# Patient Record
Sex: Female | Born: 1945 | Race: Black or African American | Hispanic: No | Marital: Married
Health system: Southern US, Community
[De-identification: ages and names within clinical notes are randomized; demographics above are authoritative.]

---

## 2016-12-13 ENCOUNTER — Other Ambulatory Visit: Payer: Self-pay | Admitting: Physician Assistant

## 2016-12-13 DIAGNOSIS — Z1231 Encounter for screening mammogram for malignant neoplasm of breast: Secondary | ICD-10-CM

## 2016-12-13 DIAGNOSIS — R5381 Other malaise: Secondary | ICD-10-CM

## 2016-12-17 ENCOUNTER — Other Ambulatory Visit: Payer: Self-pay | Admitting: Physician Assistant

## 2016-12-17 DIAGNOSIS — E2839 Other primary ovarian failure: Secondary | ICD-10-CM

## 2017-02-23 ENCOUNTER — Ambulatory Visit
Admission: RE | Admit: 2017-02-23 | Discharge: 2017-02-23 | Disposition: A | Discharge status: Home / Self Care | Payer: Medicare Other | Source: Ambulatory Visit | Attending: Physician Assistant | Admitting: Physician Assistant

## 2017-02-23 DIAGNOSIS — Z1231 Encounter for screening mammogram for malignant neoplasm of breast: Secondary | ICD-10-CM

## 2017-02-23 DIAGNOSIS — E2839 Other primary ovarian failure: Secondary | ICD-10-CM

## 2017-02-24 ENCOUNTER — Other Ambulatory Visit: Payer: Self-pay | Admitting: Physician Assistant

## 2017-02-24 DIAGNOSIS — R928 Other abnormal and inconclusive findings on diagnostic imaging of breast: Secondary | ICD-10-CM

## 2017-03-02 ENCOUNTER — Ambulatory Visit
Admission: RE | Admit: 2017-03-02 | Discharge: 2017-03-02 | Disposition: A | Discharge status: Home / Self Care | Payer: Medicare Other | Source: Ambulatory Visit | Attending: Physician Assistant | Admitting: Physician Assistant

## 2017-03-02 DIAGNOSIS — R928 Other abnormal and inconclusive findings on diagnostic imaging of breast: Secondary | ICD-10-CM

## 2018-05-08 ENCOUNTER — Other Ambulatory Visit: Payer: Self-pay | Admitting: Physician Assistant

## 2018-05-08 DIAGNOSIS — Z1231 Encounter for screening mammogram for malignant neoplasm of breast: Secondary | ICD-10-CM

## 2018-05-29 ENCOUNTER — Ambulatory Visit
Admission: RE | Admit: 2018-05-29 | Discharge: 2018-05-29 | Disposition: A | Discharge status: Home / Self Care | Payer: Medicare Other | Source: Ambulatory Visit | Attending: Physician Assistant | Admitting: Physician Assistant

## 2018-05-29 DIAGNOSIS — Z1231 Encounter for screening mammogram for malignant neoplasm of breast: Secondary | ICD-10-CM

## 2019-09-28 ENCOUNTER — Other Ambulatory Visit: Payer: Self-pay | Admitting: Family Medicine

## 2019-09-28 DIAGNOSIS — Z1231 Encounter for screening mammogram for malignant neoplasm of breast: Secondary | ICD-10-CM

## 2019-11-26 ENCOUNTER — Other Ambulatory Visit: Payer: Self-pay

## 2019-11-26 ENCOUNTER — Ambulatory Visit
Admission: RE | Admit: 2019-11-26 | Discharge: 2019-11-26 | Disposition: A | Discharge status: Home / Self Care | Payer: Medicare Other | Source: Ambulatory Visit | Attending: Family Medicine | Admitting: Family Medicine

## 2019-11-26 DIAGNOSIS — Z1231 Encounter for screening mammogram for malignant neoplasm of breast: Secondary | ICD-10-CM

## 2019-12-07 ENCOUNTER — Ambulatory Visit: Payer: Medicare Other | Attending: Internal Medicine

## 2019-12-07 DIAGNOSIS — Z23 Encounter for immunization: Secondary | ICD-10-CM

## 2019-12-07 NOTE — Progress Notes (Signed)
   Covid-19 Vaccination Clinic  Name:  Beth Ross    MRN: [id number] DOB: 1945-11-26  12/07/2019  Beth Ross was observed post Covid-19 immunization for 15 minutes without incidence. She was provided with Vaccine Information Sheet and instruction to access the V-Safe system.   Beth Ross was instructed to call 911 with any severe reactions post vaccine: Marland Kitchen Difficulty breathing  . Swelling of your face and throat  . A fast heartbeat  . A bad rash all over your body  . Dizziness and weakness    Immunizations Administered    Name Date Dose VIS Date Route   Pfizer COVID-19 Vaccine 12/07/2019 10:33 AM 0.3 mL 10/26/2019 Intramuscular   Manufacturer: [employer], [employer]   Lot: [id number]   NDC: [address]-5

## 2019-12-28 ENCOUNTER — Ambulatory Visit: Payer: Medicare Other | Attending: Internal Medicine

## 2019-12-28 DIAGNOSIS — Z23 Encounter for immunization: Secondary | ICD-10-CM

## 2019-12-28 NOTE — Progress Notes (Signed)
   Covid-19 Vaccination Clinic  Name:  Beth Ross    MRN: [id number] DOB: May 28, 1946  12/28/2019  Beth Ross was observed post Covid-19 immunization for 15 minutes without incidence. She was provided with Vaccine Information Sheet and instruction to access the V-Safe system.   Beth Ross was instructed to call 911 with any severe reactions post vaccine: Marland Kitchen Difficulty breathing  . Swelling of your face and throat  . A fast heartbeat  . A bad rash all over your body  . Dizziness and weakness    Immunizations Administered    Name Date Dose VIS Date Route   Pfizer COVID-19 Vaccine 12/28/2019  1:10 AM 0.3 mL 10/26/2019 Intramuscular   Manufacturer: [employer], [employer]   Lot: [id number]   NDC: [address]-7

## 2020-11-25 ENCOUNTER — Other Ambulatory Visit: Payer: Self-pay | Admitting: Family Medicine

## 2020-11-25 DIAGNOSIS — Z1231 Encounter for screening mammogram for malignant neoplasm of breast: Secondary | ICD-10-CM

## 2021-01-02 ENCOUNTER — Other Ambulatory Visit: Payer: Self-pay

## 2021-01-02 ENCOUNTER — Ambulatory Visit: Payer: Medicare Other | Admitting: Podiatry

## 2021-01-02 ENCOUNTER — Encounter: Payer: Self-pay | Admitting: Podiatry

## 2021-01-02 DIAGNOSIS — B351 Tinea unguium: Secondary | ICD-10-CM | POA: Diagnosis not present

## 2021-01-02 DIAGNOSIS — M79674 Pain in right toe(s): Secondary | ICD-10-CM | POA: Diagnosis not present

## 2021-01-02 DIAGNOSIS — M79675 Pain in left toe(s): Secondary | ICD-10-CM | POA: Diagnosis not present

## 2021-01-02 DIAGNOSIS — E119 Type 2 diabetes mellitus without complications: Secondary | ICD-10-CM

## 2021-01-02 NOTE — Progress Notes (Signed)
  Subjective:  Patient ID: Beth Ross, female    DOB: 03-29-1946,  MRN: [id number]  Chief Complaint  Patient presents with  . Diabetes    Diabetic foot exam    75 y.o. female returns for the above complaint.  Patient presents with thickened elongated dystrophic toenails x10.  Patient states is painful to touch.  Patient would like to have them debrided down.  She is a diabetic with last A1c of 7.  She denies any other acute complaints.  She would like for me to debride her nails down.  She was recently diagnosed with diabetes  Objective:  There were no vitals filed for this visit. Podiatric Exam: Vascular: dorsalis pedis and posterior tibial pulses are palpable bilateral. Capillary return is immediate. Temperature gradient is WNL. Skin turgor WNL  Sensorium: Normal Semmes Weinstein monofilament test. Normal tactile sensation bilaterally. Nail Exam: Pt has thick disfigured discolored nails with subungual debris noted bilateral entire nail hallux through fifth toenails.  Pain on palpation to the nails. Ulcer Exam: There is no evidence of ulcer or pre-ulcerative changes or infection. Orthopedic Exam: Muscle tone and strength are WNL. No limitations in general ROM. No crepitus or effusions noted. HAV  B/L.  Hammer toes 2-5  B/L. Skin: No Porokeratosis. No infection or ulcers    Assessment & Plan:   1. Type 2 diabetes mellitus without complication, without long-term current use of insulin (HCC)   2. Pain due to onychomycosis of toenails of both feet     Patient was evaluated and treated and all questions answered.  Onychomycosis with pain  -Nails palliatively debrided as below. -Educated on self-care  Procedure: Nail Debridement Rationale: pain  Type of Debridement: manual, sharp debridement. Instrumentation: Nail nipper, rotary burr. Number of Nails: 10  Procedures and Treatment: Consent by patient was obtained for treatment procedures. The patient understood the discussion of  treatment and procedures well. All questions were answered thoroughly reviewed. Debridement of mycotic and hypertrophic toenails, 1 through 5 bilateral and clearing of subungual debris. No ulceration, no infection noted.  Return Visit-Office Procedure: Patient instructed to return to the office for a follow up visit 3 months for continued evaluation and treatment.  Nicholes Rough, DPM    No follow-ups on file.

## 2021-01-06 ENCOUNTER — Other Ambulatory Visit: Payer: Self-pay

## 2021-01-06 ENCOUNTER — Ambulatory Visit
Admission: RE | Admit: 2021-01-06 | Discharge: 2021-01-06 | Disposition: A | Discharge status: Home / Self Care | Payer: Medicare Other | Source: Ambulatory Visit | Attending: Family Medicine | Admitting: Family Medicine

## 2021-01-06 DIAGNOSIS — Z1231 Encounter for screening mammogram for malignant neoplasm of breast: Secondary | ICD-10-CM

## 2021-01-26 ENCOUNTER — Telehealth: Payer: Self-pay | Admitting: Podiatry

## 2021-01-26 ENCOUNTER — Encounter: Payer: Self-pay | Admitting: Podiatry

## 2021-01-26 NOTE — Telephone Encounter (Signed)
Called patient left vm to reschedule 5/20 appt also sent letter

## 2021-04-03 ENCOUNTER — Ambulatory Visit: Payer: Medicare Other | Admitting: Podiatry

## 2021-04-17 ENCOUNTER — Ambulatory Visit: Payer: Medicare Other | Admitting: Podiatry

## 2021-04-17 ENCOUNTER — Other Ambulatory Visit: Payer: Self-pay

## 2021-04-17 DIAGNOSIS — B353 Tinea pedis: Secondary | ICD-10-CM | POA: Diagnosis not present

## 2021-04-17 DIAGNOSIS — B351 Tinea unguium: Secondary | ICD-10-CM

## 2021-04-17 DIAGNOSIS — M79675 Pain in left toe(s): Secondary | ICD-10-CM

## 2021-04-17 DIAGNOSIS — E119 Type 2 diabetes mellitus without complications: Secondary | ICD-10-CM

## 2021-04-17 DIAGNOSIS — M79674 Pain in right toe(s): Secondary | ICD-10-CM

## 2021-04-17 MED ORDER — CLOTRIMAZOLE-BETAMETHASONE 1-0.05 % EX CREA: 1.0000 "application " | TOPICAL_CREAM | Freq: Two times a day (BID) | CUTANEOUS | 0 refills | Status: AC

## 2021-04-21 ENCOUNTER — Encounter: Payer: Self-pay | Admitting: Podiatry

## 2021-04-21 NOTE — Progress Notes (Signed)
  Subjective:  Patient ID: Beth Ross, female    DOB: June 08, 1946,  MRN: [id number]  Chief Complaint  Patient presents with  . Diabetes    Foot care    75 y.o. female returns for the above complaint.  Patient presents with thickened elongated dystrophic toenails x10.  Patient states is painful to touch.  Patient would like to have them debrided down.  She is a diabetic with last A1c of 7.  She denies any other acute complaints.  She would like for me to debride her nails down.  She also has secondary complaint athlete's foot of both lower extremity.  She is tried over-the-counter medication none of which has helped.  She states there is a lot of itching associated with it.  She has not seen anyone else prior to seeing me.  She would like to get that treated as well.  Objective:  There were no vitals filed for this visit. Podiatric Exam: Vascular: dorsalis pedis and posterior tibial pulses are palpable bilateral. Capillary return is immediate. Temperature gradient is WNL. Skin turgor WNL  Sensorium: Normal Semmes Weinstein monofilament test. Normal tactile sensation bilaterally. Nail Exam: Pt has thick disfigured discolored nails with subungual debris noted bilateral entire nail hallux through fifth toenails.  Pain on palpation to the nails. Ulcer Exam: There is no evidence of ulcer or pre-ulcerative changes or infection. Orthopedic Exam: Muscle tone and strength are WNL. No limitations in general ROM. No crepitus or effusions noted. HAV  B/L.  Hammer toes 2-5  B/L. Skin: No Porokeratosis. No infection or ulcers.  Epidermal lysis with subjective component of itching noted to bilateral lower extremity.  No ulcers noted.    Assessment & Plan:   1. Tinea pedis of both feet   2. Type 2 diabetes mellitus without complication, without long-term current use of insulin (HCC)   3. Pain due to onychomycosis of toenails of both feet     Patient was evaluated and treated and all questions  answered.  Athlete's foot bilateral -I explained to the patient the etiology of athlete's foot and various treatment options were extensively discussed.  Given that patient has failed over-the-counter medication I believe patient would benefit from prescription Lotrisone cream to be applied twice a day to bite bottom aspect of the foot.  She states understanding will do so.  If there is no resolve meant we discussed Lamisil therapy.  She states understanding -Lotrisone cream was sent to the pharmacy  Onychomycosis with pain  -Nails palliatively debrided as below. -Educated on self-care  Procedure: Nail Debridement Rationale: pain  Type of Debridement: manual, sharp debridement. Instrumentation: Nail nipper, rotary burr. Number of Nails: 10  Procedures and Treatment: Consent by patient was obtained for treatment procedures. The patient understood the discussion of treatment and procedures well. All questions were answered thoroughly reviewed. Debridement of mycotic and hypertrophic toenails, 1 through 5 bilateral and clearing of subungual debris. No ulceration, no infection noted.  Return Visit-Office Procedure: Patient instructed to return to the office for a follow up visit 3 months for continued evaluation and treatment.  Nicholes Rough, DPM    No follow-ups on file.

## 2021-05-03 IMAGING — MG DIGITAL SCREENING BILAT W/ TOMO W/ CAD
6 of 10 series · 6 of 30 positions shown · non-contrast
Comparison: Previous exam(s).

ACR Breast Density Category a: The breast tissue is almost entirely
fatty.

CLINICAL DATA: Screening.

EXAM:
DIGITAL SCREENING BILATERAL MAMMOGRAM WITH TOMO AND CAD

[L CC synth-2D]
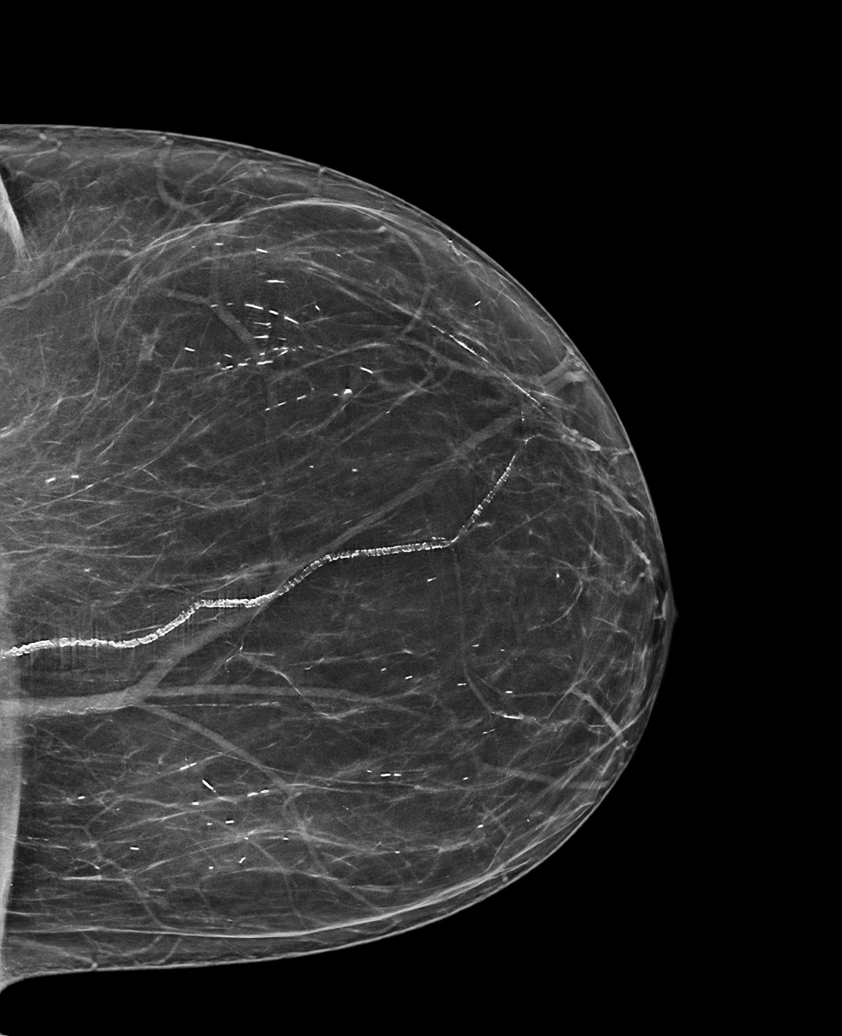

[R MLO synth-2D (1 of 2)]
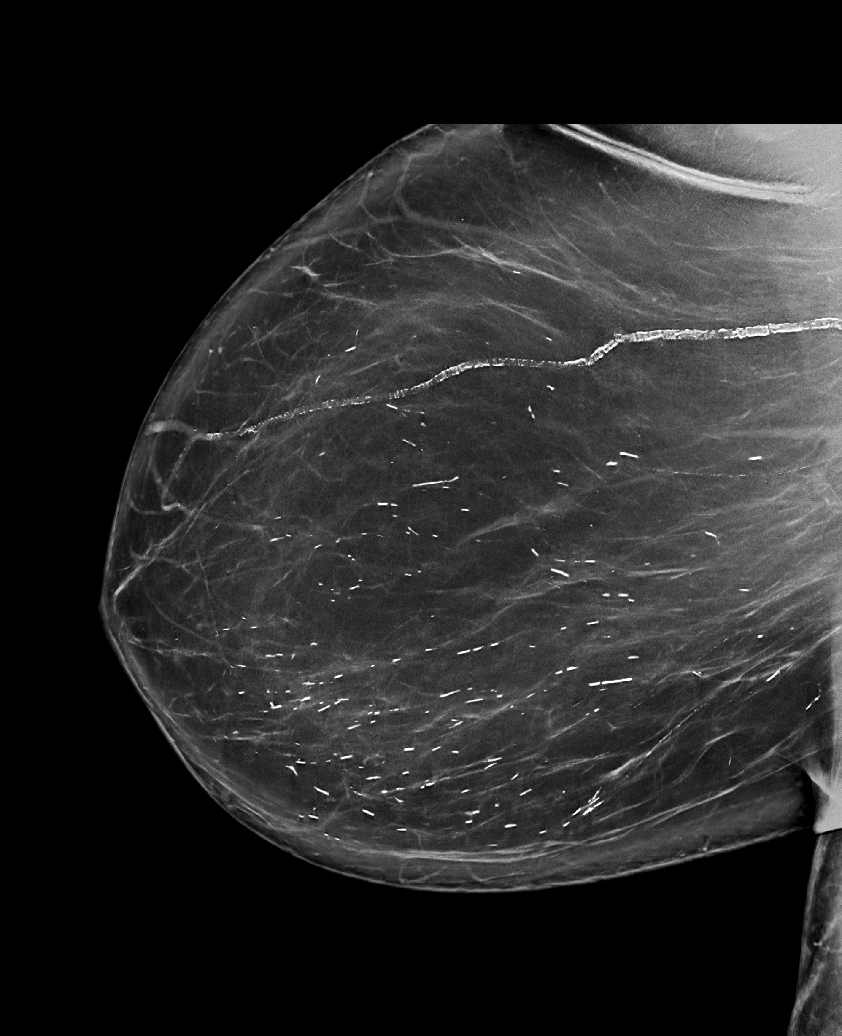

[R CC synth-2D]
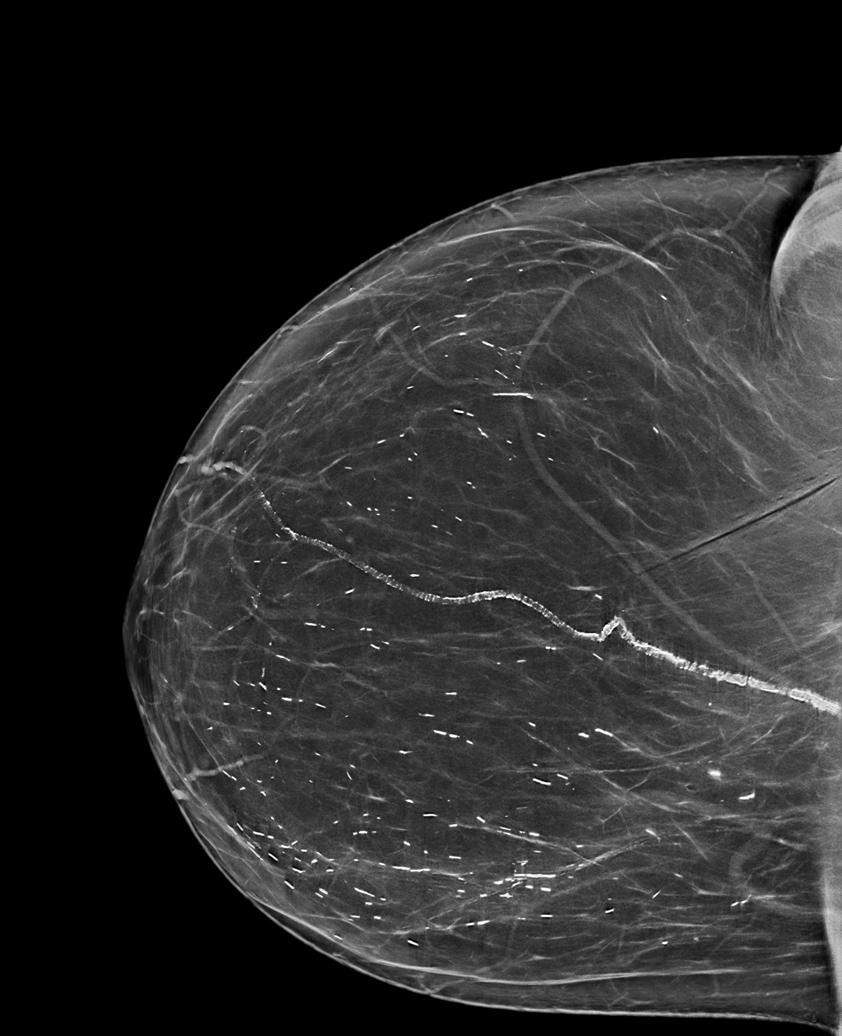

[L MLO synth-2D]
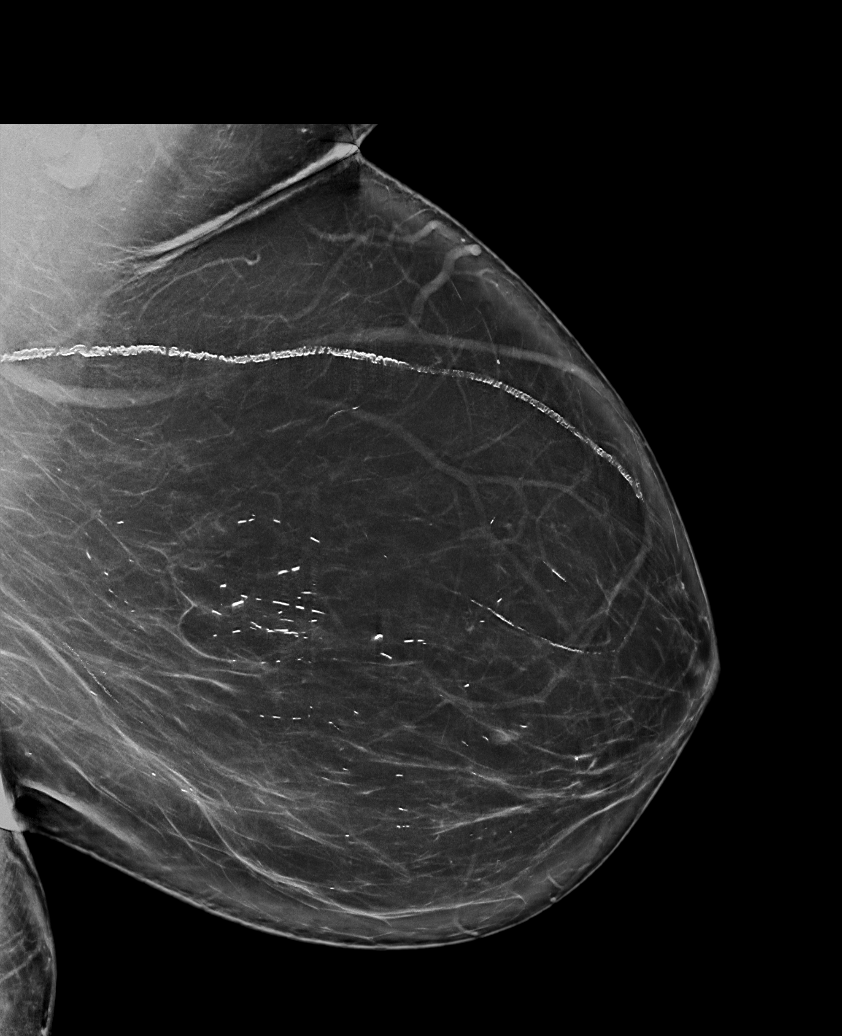

[R MLO synth-2D (2 of 2)]
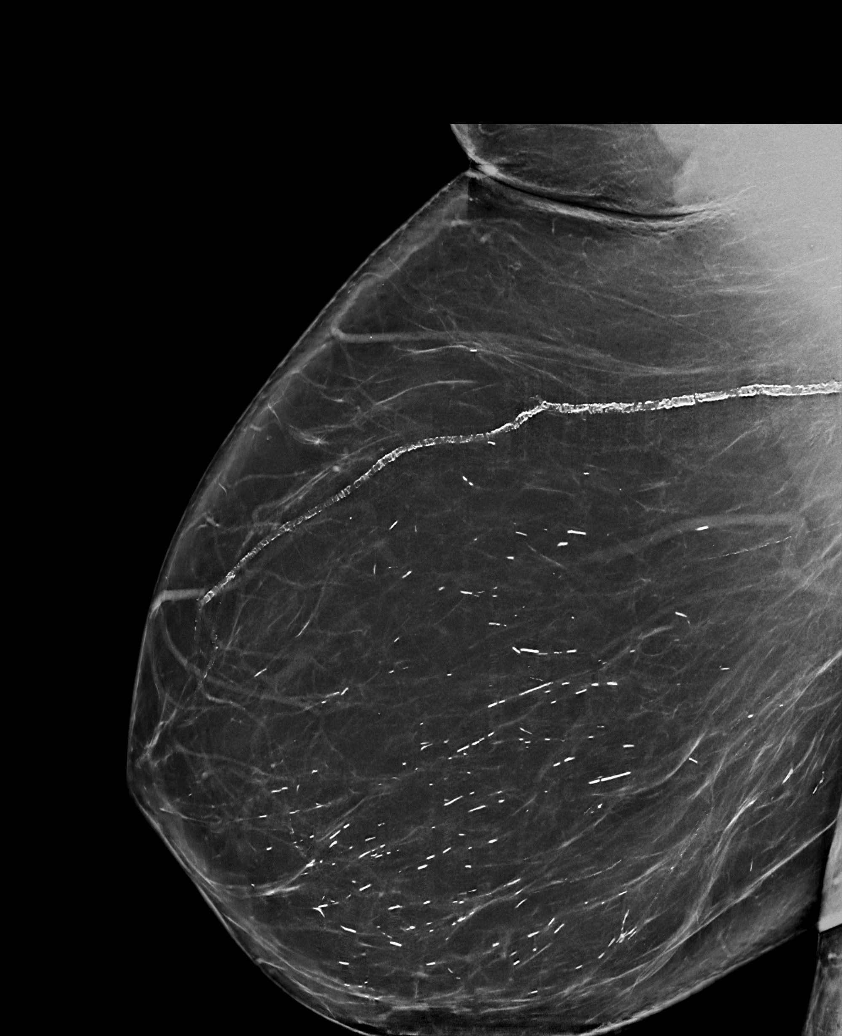

[L MLO tomo · tomo slice 50/99.0]
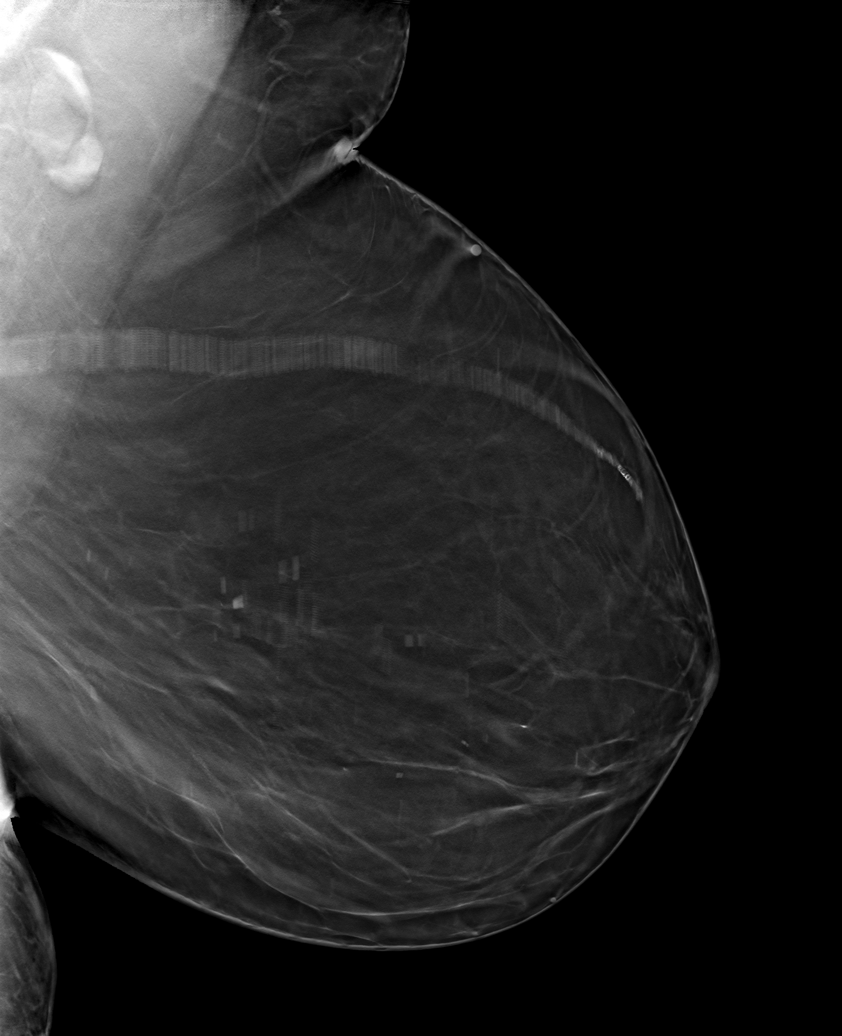

[6 of 30 positions shown; findings below may reference images not displayed]

FINDINGS: There are no findings suspicious for malignancy. Images were
processed with CAD.
IMPRESSION: No mammographic evidence of malignancy. A result letter of this
screening mammogram will be mailed directly to the patient.

RECOMMENDATION:
Screening mammogram in one year. (Code:[id number]-[id number])

BI-RADS CATEGORY  1: Negative.

## 2021-07-24 ENCOUNTER — Ambulatory Visit: Payer: Medicare Other | Admitting: Podiatry

## 2022-01-25 ENCOUNTER — Other Ambulatory Visit: Payer: Self-pay | Admitting: Family Medicine

## 2022-01-25 DIAGNOSIS — Z1231 Encounter for screening mammogram for malignant neoplasm of breast: Secondary | ICD-10-CM

## 2022-02-03 ENCOUNTER — Ambulatory Visit
Admission: RE | Admit: 2022-02-03 | Discharge: 2022-02-03 | Disposition: A | Discharge status: Home / Self Care | Payer: Medicare Other | Source: Ambulatory Visit | Attending: Family Medicine | Admitting: Family Medicine

## 2022-02-03 DIAGNOSIS — Z1231 Encounter for screening mammogram for malignant neoplasm of breast: Secondary | ICD-10-CM

## 2023-01-14 ENCOUNTER — Other Ambulatory Visit: Payer: Self-pay | Admitting: Family Medicine

## 2023-01-14 DIAGNOSIS — Z1231 Encounter for screening mammogram for malignant neoplasm of breast: Secondary | ICD-10-CM

## 2023-03-10 ENCOUNTER — Ambulatory Visit
Admission: RE | Admit: 2023-03-10 | Discharge: 2023-03-10 | Disposition: A | Discharge status: Home / Self Care | Payer: Medicare Other | Source: Ambulatory Visit | Attending: Family Medicine | Admitting: Family Medicine

## 2023-03-10 DIAGNOSIS — Z1231 Encounter for screening mammogram for malignant neoplasm of breast: Secondary | ICD-10-CM

## 2024-02-17 ENCOUNTER — Other Ambulatory Visit: Payer: Self-pay | Admitting: Family Medicine

## 2024-02-17 DIAGNOSIS — Z1231 Encounter for screening mammogram for malignant neoplasm of breast: Secondary | ICD-10-CM

## 2024-03-13 ENCOUNTER — Ambulatory Visit
Admission: RE | Admit: 2024-03-13 | Discharge: 2024-03-13 | Disposition: A | Discharge status: Home / Self Care | Source: Ambulatory Visit | Attending: Family Medicine | Admitting: Family Medicine

## 2024-03-13 DIAGNOSIS — Z1231 Encounter for screening mammogram for malignant neoplasm of breast: Secondary | ICD-10-CM
